# Patient Record
Sex: Male | Born: 1990 | Race: Black or African American | Hispanic: No | Marital: Single | Smoking: Never smoker
Health system: Southern US, Community
[De-identification: ages and names within clinical notes are randomized; demographics above are authoritative.]

## PROBLEM LIST (undated history)

## (undated) DIAGNOSIS — I10 Essential (primary) hypertension: Secondary | ICD-10-CM

---
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
## 2013-06-07 ENCOUNTER — Emergency Department: Payer: Self-pay

## 2014-01-12 ENCOUNTER — Emergency Department: Payer: Self-pay | Admitting: Emergency Medicine

## 2014-01-15 LAB — BETA STREP CULTURE(ARMC)

## 2014-04-09 ENCOUNTER — Emergency Department: Payer: Self-pay | Admitting: Emergency Medicine

## 2019-05-06 ENCOUNTER — Telehealth: Payer: Self-pay

## 2019-05-06 NOTE — Telephone Encounter (Signed)
Copied from CRM [id number]. Topic: Appointment Scheduling - New Patient >> May 06, 2019  2:25 PM Jeffery Kelly D wrote: New patient has been scheduled for your office.  Not yet Provider: Pt states that his grandmother Tiney Rouge sees Joycelyn Man and would like to know if she will see him as a new patient  Contact # [phone]   Route to department's PEC pool.

## 2019-05-07 NOTE — Telephone Encounter (Signed)
Yes this is fine, but he will not be able to be seen for NP visit until March due to [place] putting these visits on hold for acute visits

## 2019-05-07 NOTE — Telephone Encounter (Signed)
Left detailed message for patient to call back and schedule appointment in March.

## 2019-05-26 ENCOUNTER — Telehealth: Payer: Self-pay

## 2019-05-27 NOTE — Telephone Encounter (Signed)
Patient to call back and schedule NP appt.

## 2019-12-07 ENCOUNTER — Other Ambulatory Visit: Payer: Self-pay

## 2019-12-07 ENCOUNTER — Emergency Department: Payer: 59

## 2019-12-07 ENCOUNTER — Emergency Department
Admission: EM | Admit: 2019-12-07 | Discharge: 2019-12-07 | Disposition: A | Discharge status: Home / Self Care | Payer: 59 | Attending: Emergency Medicine | Admitting: Emergency Medicine

## 2019-12-07 DIAGNOSIS — Y9389 Activity, other specified: Secondary | ICD-10-CM | POA: Insufficient documentation

## 2019-12-07 DIAGNOSIS — Y998 Other external cause status: Secondary | ICD-10-CM | POA: Diagnosis not present

## 2019-12-07 DIAGNOSIS — Y9289 Other specified places as the place of occurrence of the external cause: Secondary | ICD-10-CM | POA: Diagnosis not present

## 2019-12-07 DIAGNOSIS — F10920 Alcohol use, unspecified with intoxication, uncomplicated: Secondary | ICD-10-CM

## 2019-12-07 LAB — COMPREHENSIVE METABOLIC PANEL
ALT: 21 U/L (ref 0–44)
AST: 30 U/L (ref 15–41)
Albumin: 4 g/dL (ref 3.5–5.0)
Alkaline Phosphatase: 49 U/L (ref 38–126)
Anion gap: 11 (ref 5–15)
BUN: 8 mg/dL (ref 6–20)
CO2: 25 mmol/L (ref 22–32)
Calcium: 9 mg/dL (ref 8.9–10.3)
Chloride: 106 mmol/L (ref 98–111)
Creatinine, Ser: 1.11 mg/dL (ref 0.61–1.24)
GFR calc Af Amer: >60 mL/min (ref >60)
GFR calc non Af Amer: >60 mL/min (ref >60)
Glucose, Bld: 126 mg/dL — ABNORMAL HIGH (ref 70–99)
Potassium: 4.3 mmol/L (ref 3.5–5.1)
Sodium: 142 mmol/L (ref 135–145)
Total Bilirubin: 0.9 mg/dL (ref 0.3–1.2)
Total Protein: 7.2 g/dL (ref 6.5–8.1)

## 2019-12-07 LAB — ACETAMINOPHEN LEVEL: Acetaminophen (Tylenol), Serum: <10 ug/mL — ABNORMAL LOW (ref 10–30)

## 2019-12-07 LAB — CBC WITH DIFFERENTIAL/PLATELET
Abs Immature Granulocytes: 0.04 10*3/uL (ref 0.00–0.07)
Basophils Absolute: 0 10*3/uL (ref 0.0–0.1)
Basophils Relative: 0 %
Eosinophils Absolute: 0 10*3/uL (ref 0.0–0.5)
Eosinophils Relative: 0 %
HCT: 49.3 % (ref 39.0–52.0)
Hemoglobin: 16.4 g/dL (ref 13.0–17.0)
Immature Granulocytes: 0 %
Lymphocytes Relative: 9 %
Lymphs Abs: 1.3 10*3/uL (ref 0.7–4.0)
MCH: 29.2 pg (ref 26.0–34.0)
MCHC: 33.3 g/dL (ref 30.0–36.0)
MCV: 87.7 fL (ref 80.0–100.0)
Monocytes Absolute: 0.3 10*3/uL (ref 0.1–1.0)
Monocytes Relative: 2 %
Neutro Abs: 12.6 10*3/uL — ABNORMAL HIGH (ref 1.7–7.7)
Neutrophils Relative %: 89 %
Platelets: 354 10*3/uL (ref 150–400)
RBC: 5.62 MIL/uL (ref 4.22–5.81)
RDW: 13.5 % (ref 11.5–15.5)
WBC: 14.2 10*3/uL — ABNORMAL HIGH (ref 4.0–10.5)
nRBC: 0 % (ref 0.0–0.2)

## 2019-12-07 LAB — TROPONIN I (HIGH SENSITIVITY): Troponin I (High Sensitivity): 3 ng/L (ref <18)

## 2019-12-07 LAB — LACTIC ACID, PLASMA: Lactic Acid, Venous: 1.7 mmol/L (ref 0.5–1.9)

## 2019-12-07 LAB — ETHANOL: Alcohol, Ethyl (B): 148 mg/dL — ABNORMAL HIGH (ref <10)
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
## 2019-12-07 NOTE — ED Notes (Signed)
Unable to complete screen or suicide questions due to patient lethargy.
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
## 2019-12-07 NOTE — ED Notes (Signed)
Pt taken to CT.

## 2020-07-23 ENCOUNTER — Emergency Department: Payer: 59

## 2020-07-23 ENCOUNTER — Other Ambulatory Visit: Payer: Self-pay

## 2020-07-23 ENCOUNTER — Emergency Department
Admission: EM | Admit: 2020-07-23 | Discharge: 2020-07-23 | Disposition: A | Discharge status: Home / Self Care | Payer: 59 | Attending: Emergency Medicine | Admitting: Emergency Medicine

## 2020-07-23 DIAGNOSIS — N23 Unspecified renal colic: Secondary | ICD-10-CM | POA: Diagnosis not present

## 2020-07-23 DIAGNOSIS — N200 Calculus of kidney: Secondary | ICD-10-CM | POA: Diagnosis not present

## 2020-07-23 DIAGNOSIS — R109 Unspecified abdominal pain: Secondary | ICD-10-CM | POA: Diagnosis present

## 2020-07-23 LAB — HEPATIC FUNCTION PANEL
ALT: 29 U/L (ref 0–44)
AST: 28 U/L (ref 15–41)
Albumin: 4.4 g/dL (ref 3.5–5.0)
Alkaline Phosphatase: 57 U/L (ref 38–126)
Bilirubin, Direct: 0.1 mg/dL (ref 0.0–0.2)
Indirect Bilirubin: 0.7 mg/dL (ref 0.3–0.9)
Total Bilirubin: 0.8 mg/dL (ref 0.3–1.2)
Total Protein: 7.7 g/dL (ref 6.5–8.1)

## 2020-07-23 LAB — URINALYSIS, ROUTINE W REFLEX MICROSCOPIC
Bilirubin Urine: NEGATIVE
Glucose, UA: NEGATIVE mg/dL
Hgb urine dipstick: NEGATIVE
Ketones, ur: NEGATIVE mg/dL
Leukocytes,Ua: NEGATIVE
Nitrite: NEGATIVE
Protein, ur: NEGATIVE mg/dL
Specific Gravity, Urine: 1.024 (ref 1.005–1.030)
pH: 7 (ref 5.0–8.0)

## 2020-07-23 LAB — CBC WITH DIFFERENTIAL/PLATELET
Abs Immature Granulocytes: 0.09 10*3/uL — ABNORMAL HIGH (ref 0.00–0.07)
Basophils Absolute: 0.1 10*3/uL (ref 0.0–0.1)
Basophils Relative: 0 %
Eosinophils Absolute: 0.1 10*3/uL (ref 0.0–0.5)
Eosinophils Relative: 1 %
HCT: 48.1 % (ref 39.0–52.0)
Hemoglobin: 16.3 g/dL (ref 13.0–17.0)
Immature Granulocytes: 1 %
Lymphocytes Relative: 9 %
Lymphs Abs: 1.7 10*3/uL (ref 0.7–4.0)
MCH: 29.4 pg (ref 26.0–34.0)
MCHC: 33.9 g/dL (ref 30.0–36.0)
MCV: 86.8 fL (ref 80.0–100.0)
Monocytes Absolute: 0.8 10*3/uL (ref 0.1–1.0)
Monocytes Relative: 4 %
Neutro Abs: 16 10*3/uL — ABNORMAL HIGH (ref 1.7–7.7)
Neutrophils Relative %: 85 %
Platelets: 441 10*3/uL — ABNORMAL HIGH (ref 150–400)
RBC: 5.54 MIL/uL (ref 4.22–5.81)
RDW: 12.5 % (ref 11.5–15.5)
WBC: 18.9 10*3/uL — ABNORMAL HIGH (ref 4.0–10.5)
nRBC: 0 % (ref 0.0–0.2)

## 2020-07-23 LAB — BASIC METABOLIC PANEL
Anion gap: 9 (ref 5–15)
BUN: 17 mg/dL (ref 6–20)
CO2: 23 mmol/L (ref 22–32)
Calcium: 9.6 mg/dL (ref 8.9–10.3)
Chloride: 105 mmol/L (ref 98–111)
Creatinine, Ser: 1.4 mg/dL — ABNORMAL HIGH (ref 0.61–1.24)
GFR, Estimated: >60 mL/min (ref >60)
Glucose, Bld: 104 mg/dL — ABNORMAL HIGH (ref 70–99)
Potassium: 4.1 mmol/L (ref 3.5–5.1)
Sodium: 137 mmol/L (ref 135–145)

## 2020-07-23 LAB — LIPASE, BLOOD: Lipase: 36 U/L (ref 11–51)

## 2020-07-23 MED ORDER — ONDANSETRON 4 MG PO TBDP: 4.0000 mg | ORAL_TABLET | Freq: Three times a day (TID) | ORAL | 0 refills | Status: DC | PRN

## 2020-07-23 MED ORDER — IBUPROFEN 800 MG PO TABS
800.0000 mg | ORAL_TABLET | Freq: Three times a day (TID) | ORAL | 0 refills | Status: DC | PRN
Start: 2020-07-23 — End: 2023-03-16

## 2020-07-23 MED ORDER — TAMSULOSIN HCL 0.4 MG PO CAPS
0.4000 mg | ORAL_CAPSULE | Freq: Every day | ORAL | 0 refills | Status: AC
Start: 2020-07-23 — End: 2020-07-30

## 2020-07-23 MED ORDER — IBUPROFEN 800 MG PO TABS
800.0000 mg | ORAL_TABLET | Freq: Once | ORAL | Status: AC
  Administered 2020-07-23: 800 mg via ORAL
  Filled 2020-07-23: qty 1

## 2020-07-23 MED ORDER — TAMSULOSIN HCL 0.4 MG PO CAPS
0.4000 mg | ORAL_CAPSULE | Freq: Once | ORAL | Status: AC
  Administered 2020-07-23: 0.4 mg via ORAL
  Filled 2020-07-23: qty 1

## 2020-07-23 NOTE — ED Provider Notes (Signed)
[facility] Emergency Department Provider Note  ____________________________________________  Time seen: Approximately 3:22 AM  I have reviewed the triage vital signs and the nursing notes.   HISTORY  Chief Complaint Flank Pain   [name] is a 30 y.o. male no significant past medical history who presents for evaluation of abdominal pain.  Patient reports the pain started earlier today.  Describes the pain as constant sharp located on the right upper lateral part of his abdomen, currently 6 out of 10.  Patient reports that while he was at work the pain became more severe and he took ibuprofen which helped for a little bit.  He denies nausea, vomiting, diarrhea, constipation, dysuria, hematuria.  No prior history of kidney stones.  No prior abdominal surgeries.  No chest pain or shortness of breath.   PMH None - reviewed  Allergies Patient has no allergy information on record.  No family history on file.  Social History Social History   Tobacco Use  . Smoking status: Never Smoker  . Smokeless tobacco: Never Used  Substance Use Topics  . Alcohol use: Yes  . Drug use: Not Currently    Review of Systems  Constitutional: Negative for fever. Eyes: Negative for visual changes. ENT: Negative for sore throat. Neck: No neck pain  Cardiovascular: Negative for chest pain. Respiratory: Negative for shortness of breath. Gastrointestinal: + abdominal pain. No vomiting or diarrhea. Genitourinary: Negative for dysuria. Musculoskeletal: Negative for back pain. Skin: Negative for rash. Neurological: Negative for headaches, weakness or numbness. Psych: No SI or HI  ____________________________________________   PHYSICAL EXAM:  VITAL SIGNS: ED Triage Vitals  Enc Vitals Group     BP 07/23/20 0245 (!) 165/76     Pulse Rate 07/23/20 0245 98     Resp 07/23/20 0245 18     Temp 07/23/20 0245 98.8 F (37.1 C)     Temp Source 07/23/20  0245 Oral     SpO2 07/23/20 0245 96 %     Weight 07/23/20 0243 215 lb (97.5 kg)     Height 07/23/20 0243 5\' 7"  (1.702 m)     Head Circumference --      Peak Flow --      Pain Score 07/23/20 0243 6     Pain Loc --      Pain Edu? --      Excl. in GC? --     Constitutional: Alert and oriented. Well appearing and in no apparent distress. HEENT:      Head: Normocephalic and atraumatic.         Eyes: Conjunctivae are normal. Sclera is non-icteric.       Mouth/Throat: Mucous membranes are moist.       Neck: Supple with no signs of meningismus. Cardiovascular: Regular rate and rhythm. No murmurs, gallops, or rubs. 2+ symmetrical distal pulses are present in all extremities. No JVD. Respiratory: Normal respiratory effort. Lungs are clear to auscultation bilaterally.  Gastrointestinal: Soft, non tender, and non distended with positive bowel sounds. No rebound or guarding. Genitourinary: No CVA tenderness. Musculoskeletal:  No edema, cyanosis, or erythema of extremities. Neurologic: Normal speech and language. Face is symmetric. Moving all extremities. No gross focal neurologic deficits are appreciated. Skin: Skin is warm, dry and intact. No rash noted. Psychiatric: Mood and affect are normal. Speech and behavior are normal.  ____________________________________________   LABS (all labs ordered are listed, but only abnormal results are displayed)  Labs Reviewed  URINALYSIS, ROUTINE W REFLEX MICROSCOPIC -  Abnormal; Notable for the following components:      Result Value   Color, Urine YELLOW (*)    APPearance HAZY (*)    All other components within normal limits  CBC WITH DIFFERENTIAL/PLATELET - Abnormal; Notable for the following components:   WBC 18.9 (*)    Platelets 441 (*)    Neutro Abs 16.0 (*)    Abs Immature Granulocytes 0.09 (*)    All other components within normal limits  BASIC METABOLIC PANEL - Abnormal; Notable for the following components:   Glucose, Bld 104 (*)     Creatinine, Ser 1.40 (*)    All other components within normal limits  LIPASE, BLOOD  HEPATIC FUNCTION PANEL   ____________________________________________  EKG  none  ____________________________________________  RADIOLOGY  I have personally reviewed the images performed during this visit and I agree with the Radiologist's read.   Interpretation by Radiologist:  CT Renal Stone Study  Result Date: 07/23/2020 CLINICAL DATA:  Right-sided flank pain EXAM: CT ABDOMEN AND PELVIS WITHOUT CONTRAST TECHNIQUE: Multidetector CT imaging of the abdomen and pelvis was performed following the standard protocol without IV contrast. COMPARISON:  None. FINDINGS: Lower chest: No acute abnormality. Hepatobiliary: No focal liver abnormality is seen. No gallstones, gallbladder wall thickening, or biliary dilatation. Pancreas: Unremarkable. No pancreatic ductal dilatation or surrounding inflammatory changes. Spleen: Normal in size without focal abnormality. Adrenals/Urinary Tract: Adrenal glands are within normal limits. Left kidney demonstrates multiple tiny nonobstructing renal stones. Left ureter is unremarkable. Right kidney demonstrates tiny nonobstructing stones although mild hydronephrosis and hydroureter is noted secondary to a small right UVJ stone measuring 2-3 mm. Bladder is decompressed. Stomach/Bowel: The appendix is within normal limits. No obstructive or inflammatory changes of the colon small seen. Stomach is within normal limits. Vascular/Lymphatic: No significant vascular findings are present. No enlarged abdominal or pelvic lymph nodes. Reproductive: Prostate is unremarkable. Other: No abdominal wall hernia or abnormality. No abdominopelvic ascites. Musculoskeletal: No acute or significant osseous findings. IMPRESSION: 2-3 mm right UVJ stone with mild hydronephrosis and hydroureter. Tiny nonobstructing renal stones bilaterally. No other focal abnormality is noted. Electronically Signed   By: Alcide Clever M.D.   On: 07/23/2020 03:55      ____________________________________________   PROCEDURES  Procedure(s) performed: None Procedures Critical Care performed:  None ____________________________________________   INITIAL IMPRESSION / ASSESSMENT AND PLAN / ED COURSE   30 y.o. male no significant past medical history who presents for evaluation of right upper abdominal pain.  Patient is well-appearing in no distress with normal vital signs, abdomen soft with no tenderness, no CVA tenderness.  Differential diagnoses including kidney stone versus pyelonephritis versus gallbladder pathology versus pancreatitis versus appendicitis.  Plan for CBC, CMP, lipase, urinalysis, and imaging.  Will give 800 mg of ibuprofen for pain.  _________________________ 4:23 AM on 07/23/2020 -----------------------------------------  CT consistent with a 2 mm right UVJ stone, visualized by me and confirmed by radiology.  UA with no signs of overlying infection.  No AKI.  After 800 mg of ibuprofen patient is pain-free.  Will discharge home on Flomax, ibuprofen, Zofran.  Discussed dietary changes and follow-up with PCP.  Discussed my return precautions for any signs of UTI, uncontrolled pain, uncontrolled nausea or vomiting, or fever.     _____________________________________________ Please note:  Patient was evaluated in Emergency Department today for the symptoms described in the history of present illness. Patient was evaluated in the context of the global COVID-19 pandemic, which necessitated consideration that the patient might be  at risk for infection with the SARS-CoV-2 virus that causes COVID-19. Institutional protocols and algorithms that pertain to the evaluation of patients at risk for COVID-19 are in a state of rapid change based on information released by regulatory bodies including the CDC and federal and state organizations. These policies and algorithms were followed during the patient's care  in the ED.  Some ED evaluations and interventions may be delayed as a result of limited staffing during the pandemic.   [place] Controlled Substance Database was reviewed by me. ____________________________________________   FINAL CLINICAL IMPRESSION(S) / ED DIAGNOSES   Final diagnoses:  Renal colic  Kidney stone      NEW MEDICATIONS STARTED DURING THIS VISIT:  ED Discharge Orders         Ordered    ibuprofen (ADVIL) 800 MG tablet  Every 8 hours PRN        07/23/20 0422    tamsulosin (FLOMAX) 0.4 MG CAPS capsule  Daily        07/23/20 0422    ondansetron (ZOFRAN ODT) 4 MG disintegrating tablet  Every 8 hours PRN        07/23/20 0422           Note:  This document was prepared using Dragon voice recognition software and may include unintentional dictation errors.    Don Perking, [place], MD 07/23/20 [phone]

## 2020-07-23 NOTE — ED Triage Notes (Signed)
Pt states when he woke up this morning he began to have right sided flank pain, Pt states he is having a decrease in urine today but denies burning or blood in urine.

## 2020-07-23 NOTE — Discharge Instructions (Addendum)
You have been seen in the Emergency Department (ED)  Today and was diagnosed with kidney stones. While the stone is traveling through the ureter, which is the tube that carries urine from the kidney to the bladder, you will probably feel pain. The pain may be mild or very severe. You may also have some blood in your urine. As soon as the stone reaches the bladder, any intense pain should go away. If a stone is too large to pass on its own, you may need a medical procedure to help you pass the stone.   As we have discussed, please drink plenty of fluids and use a urinary strainer to attempt to capture the stone.  Please make a follow up appointment with Urology in the next week by calling the number below and bring the stone with you.  Pain control: Take ibuprofen 800mg  every 6 hours for the pain. Please also take your prescribed flomax daily.   Follow-up with your doctor or return to the ER in 12-24 hours if your pain is not well controlled, if you develop pain or burning with urination, or if you develop a fever. Otherwise follow up in 3-5 days with your doctor.  When should you call for help?  Call your doctor now or seek immediate medical care if:  You cannot keep down fluids.  Your pain gets worse.  You have a fever or chills.  You have new or worse pain in your back just below your rib cage (the flank area).  You have new or more blood in your urine. You have pain or burning with urination You are unable to urinate You have abdominal pain  Watch closely for changes in your health, and be sure to contact your doctor if:  You do not get better as expected  How can you care for yourself at home?  Drink plenty of fluids, enough so that your urine is light yellow or clear like water. If you have kidney, heart, or liver disease and have to limit fluids, talk with your doctor before you increase the amount of fluids you drink.  Take pain medicines exactly as directed. Call your doctor if you  think you are having a problem with your medicine.  If the doctor gave you a prescription medicine for pain, take it as prescribed.  If you are not taking a prescription pain medicine, ask your doctor if you can take an over-the-counter medicine. Read and follow all instructions on the label. Your doctor may ask you to strain your urine so that you can collect your kidney stone when it passes. You can use a kitchen strainer or a tea strainer to catch the stone. Store it in a plastic bag until you see your doctor again.  Preventing future kidney stones  Some changes in your diet may help prevent kidney stones. Depending on the cause of your stones, your doctor may recommend that you:  Drink plenty of fluids, enough so that your urine is light yellow or clear like water. If you have kidney, heart, or liver disease and have to limit fluids, talk with your doctor before you increase the amount of fluids you drink.  Limit coffee, tea, and alcohol. Also avoid grapefruit juice.  Do not take more than the recommended daily dose of vitamins C and D.  Avoid antacids such as Gaviscon, Maalox, Mylanta, or Tums.  Limit the amount of salt (sodium) in your diet.  Eat a balanced diet that is not too high  in protein.  Limit foods that are high in a substance called oxalate, which can cause kidney stones. These foods include dark green vegetables, rhubarb, chocolate, wheat bran, nuts, cranberries, and beans.
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
## 2022-10-09 NOTE — Progress Notes (Deleted)
    I,Dejon Lukas,acting as a Neurosurgeon for [employer], PA-C.,have documented all relevant documentation on the behalf of Alfredia Ferguson, PA-C,as directed by  Alfredia Ferguson, PA-C while in the presence of Alfredia Ferguson, PA-C.  New patient visit   Patient: Jeffery Kelly   DOB: 07/31/1990   32 y.o. Male  MRN: [id number] Visit Date: 10/10/2022  Today's healthcare provider: Alfredia Ferguson, PA-C   No chief complaint on file.  Subjective    Jeffery Kelly is a 32 y.o. male who presents today as a new patient to establish care.  HPI  ***  No past medical history on file. No past surgical history on file. No family status information on file.   No family history on file. Social History   Socioeconomic History   Marital status: Single    Spouse name: Not on file   Number of children: Not on file   Years of education: Not on file   Highest education level: Not on file  Occupational History   Not on file  Tobacco Use   Smoking status: Never   Smokeless tobacco: Never  Substance and Sexual Activity   Alcohol use: Yes   Drug use: Not Currently   Sexual activity: Not on file  Other Topics Concern   Not on file  Social History Narrative   Not on file   Social Determinants of Health   Financial Resource Strain: Not on file  Food Insecurity: Not on file  Transportation Needs: Not on file  Physical Activity: Not on file  Stress: Not on file  Social Connections: Not on file   Outpatient Medications Prior to Visit  Medication Sig   ibuprofen (ADVIL) 800 MG tablet Take 1 tablet (800 mg total) by mouth every 8 (eight) hours as needed.   ondansetron (ZOFRAN ODT) 4 MG disintegrating tablet Take 1 tablet (4 mg total) by mouth every 8 (eight) hours as needed.   No facility-administered medications prior to visit.   Not on File   There is no immunization history on file for this patient.  Health Maintenance  Topic Date Due   COVID-19 Vaccine (1) Never  done   HIV Screening  Never done   Hepatitis C Screening  Never done   DTaP/Tdap/Td (1 - Tdap) Never done   INFLUENZA VACCINE  11/23/2022   HPV VACCINES  Aged Out    Patient Care Team: Patient, No Pcp Per as PCP - General (General Practice)  Review of Systems  {Labs  Heme  Chem  Endocrine  Serology  Results Review (optional):23779}   Objective    There were no vitals taken for this visit. {Show previous Tatanisha Cuthbert signs (optional):23777}  Physical Exam ***  Depression Screen     No data to display         No results found for any visits on 10/10/22.  Assessment & Plan     ***  No follow-ups on file.     {provider attestation***:1}   Alfredia Ferguson, PA-C  [facility] [phone] (phone) [phone] (fax)  [facility]

## 2022-10-10 ENCOUNTER — Ambulatory Visit: Payer: Self-pay | Admitting: Physician Assistant

## 2022-11-28 ENCOUNTER — Ambulatory Visit: Payer: Self-pay | Admitting: Physician Assistant

## 2023-01-21 NOTE — Progress Notes (Deleted)
New patient visit  Patient: Jeffery Kelly   DOB: 09/25/1990   32 y.o. Male  MRN: [id number] Visit Date: 01/25/2023  Today's healthcare provider: Debera Lat, PA-C   No chief complaint on file.  Subjective    Jeffery Kelly is a 32 y.o. male who presents today as a new patient to establish care.  HPI  *** Discussed the use of AI scribe software for clinical note transcription with the patient, who gave verbal consent to proceed.  History of Present Illness            No past medical history on file. No past surgical history on file. No family status information on file.   No family history on file. Social History   Socioeconomic History   Marital status: Single    Spouse name: Not on file   Number of children: Not on file   Years of education: Not on file   Highest education level: Not on file  Occupational History   Not on file  Tobacco Use   Smoking status: Never   Smokeless tobacco: Never  Substance and Sexual Activity   Alcohol use: Yes   Drug use: Not Currently   Sexual activity: Not on file  Other Topics Concern   Not on file  Social History Narrative   Not on file   Social Determinants of Health   Financial Resource Strain: Not on file  Food Insecurity: Not on file  Transportation Needs: Not on file  Physical Activity: Not on file  Stress: Not on file  Social Connections: Not on file   Outpatient Medications Prior to Visit  Medication Sig   ibuprofen (ADVIL) 800 MG tablet Take 1 tablet (800 mg total) by mouth every 8 (eight) hours as needed.   ondansetron (ZOFRAN ODT) 4 MG disintegrating tablet Take 1 tablet (4 mg total) by mouth every 8 (eight) hours as needed.   No facility-administered medications prior to visit.   Not on File  Immunization History  Administered Date(s) Administered   DTaP 10/20/1992, 12/29/1992, 06/29/1993, 08/29/1996   HIB (PRP-OMP) 08/30/1992, 10/20/1992, 12/29/1992, 06/29/1993   HPV 9-valent  05/23/2019   HPV Quadrivalent 06/20/2011   Hepatitis B, PED/ADOLESCENT 1990-07[birth year], 08/30/1992, 02/04/1993   IPV 08/30/1992, 10/20/1992, 12/29/1992, 08/29/1996   MMR 06/29/1993, 08/29/1996   Meningococcal Conjugate 06/20/2011   Tdap 06/20/2011   Varicella 06/20/2011    Health Maintenance  Topic Date Due   HIV Screening  Never done   Hepatitis C Screening  Never done   HPV VACCINES (3 - Male 3-dose series) 08/15/2019   DTaP/Tdap/Td (6 - Td or Tdap) 06/19/2021   INFLUENZA VACCINE  Never done   COVID-19 Vaccine (1 - 2023-24 season) Never done    Patient Care Team: Patient, No Pcp Per as PCP - General (General Practice)  Review of Systems  All other systems reviewed and are negative.  Except see HPI   {Insert previous labs (optional):23779} {See past labs  Heme  Chem  Endocrine  Serology  Results Review (optional):1}   Objective    There were no vitals taken for this visit. {Insert last BP/Wt (optional):23777}{See vitals history (optional):1}   Physical Exam Vitals reviewed.  Constitutional:      General: He is not in acute distress.    Appearance: Normal appearance. He is not diaphoretic.  HENT:     Head: Normocephalic and atraumatic.  Eyes:     General: No scleral icterus.    Conjunctiva/sclera: Conjunctivae normal.  Cardiovascular:  Rate and Rhythm: Normal rate and regular rhythm.     Pulses: Normal pulses.     Heart sounds: Normal heart sounds. No murmur heard. Pulmonary:     Effort: Pulmonary effort is normal. No respiratory distress.     Breath sounds: Normal breath sounds. No wheezing or rhonchi.  Musculoskeletal:     Cervical back: Neck supple.     Right lower leg: No edema.     Left lower leg: No edema.  Lymphadenopathy:     Cervical: No cervical adenopathy.  Skin:    General: Skin is warm and dry.     Findings: No rash.  Neurological:     Mental Status: He is alert and oriented to person, place, and time. Mental status is at baseline.   Psychiatric:        Mood and Affect: Mood normal.        Behavior: Behavior normal.     Depression Screen     No data to display         No results found for any visits on 01/25/23.  Assessment & Plan     *** Assessment and Plan              Encounter to establish care Welcomed to our clinic Reviewed past medical hx, social hx, family hx and surgical hx Pt advised to send all vaccination records or screening   No follow-ups on file.    The patient was advised to call back or seek an in-person evaluation if the symptoms worsen or if the condition fails to improve as anticipated.  I discussed the assessment and treatment plan with the patient. The patient was provided an opportunity to ask questions and all were answered. The patient agreed with the plan and demonstrated an understanding of the instructions.  I, Debera Lat, PA-C have reviewed all documentation for this visit. The documentation on  01/25/23 for the exam, diagnosis, procedures, and orders are all accurate and complete.  Debera Lat, [facility], MMS [facility] [phone] (phone) [phone] (fax)  [facility]

## 2023-01-25 ENCOUNTER — Ambulatory Visit: Payer: Self-pay | Admitting: Physician Assistant

## 2023-01-25 DIAGNOSIS — Z7689 Persons encountering health services in other specified circumstances: Secondary | ICD-10-CM

## 2023-03-16 ENCOUNTER — Ambulatory Visit: Payer: Self-pay | Admitting: Family Medicine

## 2023-03-16 ENCOUNTER — Encounter: Payer: Self-pay | Admitting: Family Medicine

## 2023-03-16 VITALS — BP 137/90 | HR 74 | Resp 18 | Ht 67.0 in | Wt 225.0 lb

## 2023-03-16 DIAGNOSIS — G4730 Sleep apnea, unspecified: Secondary | ICD-10-CM

## 2023-03-16 DIAGNOSIS — I499 Cardiac arrhythmia, unspecified: Secondary | ICD-10-CM

## 2023-03-16 DIAGNOSIS — Z87442 Personal history of urinary calculi: Secondary | ICD-10-CM

## 2023-03-16 DIAGNOSIS — I10 Essential (primary) hypertension: Secondary | ICD-10-CM

## 2023-03-16 DIAGNOSIS — Z7689 Persons encountering health services in other specified circumstances: Secondary | ICD-10-CM

## 2023-03-16 MED ORDER — AMLODIPINE BESYLATE 5 MG PO TABS: 5.0000 mg | ORAL_TABLET | Freq: Every day | ORAL | 0 refills | Status: DC

## 2023-03-16 NOTE — Assessment & Plan Note (Addendum)
Consistently elevated since 2021. Will start amlodipine 5 mg daily. Patient to obtain BP cuff and monitor at home, then send the record of his BPs through MyChart.

## 2023-03-16 NOTE — Progress Notes (Signed)
New patient visit   Patient: Jeffery Kelly   DOB: 07-30-1990   32 y.o. Male  MRN: [id number] Visit Date: 03/16/2023  Today's healthcare provider: Sherlyn Hay, DO   Chief Complaint  Patient presents with   Establish Care   Subjective    Jeffery Kelly is a 32 y.o. male who presents today as a new patient to establish care.  HPI  Concerns about his sleep patterns: He reports loud snoring and was interested in a sleep study due to his mother's observations and his own history of sleep issues.   The patient's hypertension has been an ongoing issue, and he has not been under medical care for a few years. He reported that a previous healthcare provider had suggested wearing a heart monitor, but the patient did not recall the reason for this recommendation.  The patient also has a history of kidney stones but reports no recent issues related to this. He denied any current symptoms of allergies, fevers, chills, dizziness, lightheadedness, nausea, vomiting, or constipation.  The patient's cholesterol was noted to be elevated in a previous test in 2021, but no medication was initiated at that time. He denied any current smoking, alcohol use, or recreational drug use.   History reviewed. No pertinent past medical history. History reviewed. No pertinent surgical history. Family Status  Relation Name Status   Mother  (Not Specified)   MGM  (Not Specified)   MGF  (Not Specified)   PGM  (Not Specified)  No partnership data on file   Family History  Problem Relation Age of Onset   Hypertension Mother    Diabetes Mother    Cancer Maternal Grandmother    Cancer Maternal Grandfather    Hypertension Paternal Grandmother    Diabetes Paternal Grandmother    Social History   Socioeconomic History   Marital status: Single    Spouse name: Not on file   Number of children: Not on file   Years of education: Not on file   Highest education level: Not on file   Occupational History   Not on file  Tobacco Use   Smoking status: Never   Smokeless tobacco: Never  Vaping Use   Vaping status: Never Used  Substance and Sexual Activity   Alcohol use: Never   Drug use: Never   Sexual activity: Not on file  Other Topics Concern   Not on file  Social History Narrative   Not on file   Social Determinants of Health   Financial Resource Strain: Not on file  Food Insecurity: Not on file  Transportation Needs: Not on file  Physical Activity: Not on file  Stress: Not on file  Social Connections: Not on file   Outpatient Medications Prior to Visit  Medication Sig   [DISCONTINUED] ibuprofen (ADVIL) 800 MG tablet Take 1 tablet (800 mg total) by mouth every 8 (eight) hours as needed.   [DISCONTINUED] ondansetron (ZOFRAN ODT) 4 MG disintegrating tablet Take 1 tablet (4 mg total) by mouth every 8 (eight) hours as needed.   No facility-administered medications prior to visit.   No Known Allergies  Immunization History  Administered Date(s) Administered   DTaP 10/20/1992, 12/29/1992, 06/29/1993, 08/29/1996   HIB (PRP-OMP) 08/30/1992, 10/20/1992, 12/29/1992, 06/29/1993   HPV 9-valent 05/23/2019   HPV Quadrivalent 06/20/2011   Hepatitis B, PED/ADOLESCENT 07/12/1992, 08/30/1992, 02/04/1993   IPV 08/30/1992, 10/20/1992, 12/29/1992, 08/29/1996   MMR 06/29/1993, 08/29/1996   Meningococcal Conjugate 06/20/2011   Tdap 06/20/2011   Varicella 06/20/2011  Health Maintenance  Topic Date Due   HIV Screening  Never done   HPV VACCINES (3 - Male 3-dose series) 08/15/2019   DTaP/Tdap/Td (6 - Td or Tdap) 06/19/2021   INFLUENZA VACCINE  07/23/2023 (Originally 11/23/2022)   COVID-19 Vaccine (1 - 2023-24 season) 12/24/2023 (Originally 12/24/2022)   Hepatitis C Screening  Discontinued    Patient Care Team: Jaxen Samples, Monico Blitz, DO as PCP - General (Family Medicine)  Review of Systems  Constitutional:  Negative for chills and fever.  Eyes:  Negative for visual  disturbance.  Respiratory:  Positive for apnea. Negative for cough, shortness of breath and wheezing.   Cardiovascular:  Negative for chest pain, palpitations and leg swelling.  Gastrointestinal:  Negative for abdominal pain, nausea and vomiting.  Genitourinary:  Negative for dysuria and frequency.  Neurological:  Negative for dizziness, tremors, weakness, light-headedness and headaches.        Objective    BP (!) 137/90 (BP Location: Right Arm, Patient Position: Sitting, Cuff Size: Large)   Pulse 74   Resp 18   Ht 5\' 7"  (1.702 m)   Wt 225 lb (102.1 kg)   SpO2 99%   BMI 35.24 kg/m     Physical Exam Vitals reviewed.  Constitutional:      General: He is not in acute distress.    Appearance: Normal appearance. He is not diaphoretic.  HENT:     Head: Normocephalic and atraumatic.  Eyes:     General: No scleral icterus.    Conjunctiva/sclera: Conjunctivae normal.  Cardiovascular:     Rate and Rhythm: Normal rate. Rhythm irregular.     Pulses: Normal pulses.     Heart sounds: Normal heart sounds. No murmur heard.    Comments: Significant fluctuation in rate Pulmonary:     Effort: Pulmonary effort is normal. No respiratory distress.     Breath sounds: Normal breath sounds. No wheezing or rhonchi.  Musculoskeletal:     Cervical back: Neck supple.     Right lower leg: No edema.     Left lower leg: No edema.  Lymphadenopathy:     Cervical: No cervical adenopathy.  Skin:    General: Skin is warm and dry.     Findings: No rash.  Neurological:     Mental Status: He is alert and oriented to person, place, and time. Mental status is at baseline.  Psychiatric:        Mood and Affect: Mood normal.        Behavior: Behavior normal.    Depression Screen    03/16/2023    1:15 PM  PHQ 2/9 Scores  PHQ - 2 Score 0  PHQ- 9 Score 0   No results found for any visits on 03/16/23.  Assessment & Plan     Primary hypertension Assessment & Plan: Consistently elevated since  2021. Will start amlodipine 5 mg daily. Patient to obtain BP cuff and monitor at home, then send the record of his BPs through MyChart.  Orders: -     Comprehensive metabolic panel -     Lipid panel -     amLODIPine Besylate; Take 1 tablet (5 mg total) by mouth daily.  Dispense: 90 tablet; Refill: 0  Establishing care with new doctor, encounter for  Observed sleep apnea Assessment & Plan: ESS 8 STOPBANG - 7/8  Referring patient for home sleep study.  Orders: -     Ambulatory referral to Sleep Studies  History of nephrolithiasis Assessment & Plan: Noted. No acute  concerns.  Continue to monitor.    Cardiac arrhythmia, unspecified cardiac arrhythmia type Assessment & Plan: Irregular heart rate with significant variability of rate noted. No family history of sudden cardiac death or early heart attacks. Patient prefers to wait for insurance before further evaluation. Discussed monitoring and potential need for a heart monitor. Patient agreed to follow up with cardiology after insurance starts. - Refer to cardiology - Offered EKG and cardiac monitor but patient would prefer to wait until he sees cardiology after the first of the year due to lack of insurance now. - Advised patient to seek emergency care if experiencing chest pain or weakness/fainting   Orders: -     Ambulatory referral to Cardiology   Return in about 3 months (around 06/16/2023) for HTN, CPE.     I discussed the assessment and treatment plan with the patient  The patient was provided an opportunity to ask questions and all were answered. The patient agreed with the plan and demonstrated an understanding of the instructions.   The patient was advised to call back or seek an in-person evaluation if the symptoms worsen or if the condition fails to improve as anticipated.    Sherlyn Hay, DO  [facility] [phone] (phone) [phone] (fax)  [facility]

## 2023-03-16 NOTE — Patient Instructions (Signed)

## 2023-03-16 NOTE — Assessment & Plan Note (Addendum)
Irregular heart rate with significant variability of rate noted. No family history of sudden cardiac death or early heart attacks. Patient prefers to wait for insurance before further evaluation. Discussed monitoring and potential need for a heart monitor. Patient agreed to follow up with cardiology after insurance starts. - Refer to cardiology - Offered EKG and cardiac monitor but patient would prefer to wait until he sees cardiology after the first of the year due to lack of insurance now. - Advised patient to seek emergency care if experiencing chest pain or weakness/fainting

## 2023-03-16 NOTE — Assessment & Plan Note (Signed)
ESS 8 STOPBANG - 7/8  Referring patient for home sleep study.

## 2023-03-16 NOTE — Assessment & Plan Note (Signed)
Noted. No acute concerns.  Continue to monitor.

## 2023-04-19 ENCOUNTER — Telehealth: Payer: Self-pay | Admitting: Family Medicine

## 2023-04-19 DIAGNOSIS — I10 Essential (primary) hypertension: Secondary | ICD-10-CM

## 2023-04-19 MED ORDER — AMLODIPINE BESYLATE 5 MG PO TABS: 5.0000 mg | ORAL_TABLET | Freq: Every day | ORAL | 0 refills | Status: DC

## 2023-04-19 NOTE — Telephone Encounter (Signed)
Received fax from [facility] wanting refills on Amlodipine 5 mg. #90

## 2023-06-15 ENCOUNTER — Encounter: Payer: Self-pay | Admitting: Family Medicine

## 2023-06-21 ENCOUNTER — Telehealth: Payer: Self-pay | Admitting: Family Medicine

## 2023-06-21 NOTE — Telephone Encounter (Signed)
 Copied from CRM [id number]. Topic: Referral - Request for Referral >> Jun 21, 2023  8:03 AM Franchot Heidelberg wrote: Did the patient discuss referral with their provider in the last year? Yes (If No - schedule appointment) (If Yes - send message)  Appointment offered? No  Type of order/referral and detailed reason for visit: Sleep study   Preference of office, provider, location: Highest recommended   If referral order, have you been seen by this specialty before? Yes (If Yes, this issue or another issue? When? Where?  Can we respond through MyChart? Yes

## 2023-06-21 NOTE — Addendum Note (Signed)
 Addended by: Marjie Skiff on: 06/21/2023 11:00 AM   Modules accepted: Orders

## 2023-07-02 ENCOUNTER — Telehealth: Payer: Self-pay

## 2023-07-02 NOTE — Telephone Encounter (Signed)
 Copied from CRM [id number]. Topic: Referral - Status >> Jul 02, 2023  9:47 AM Elmarie Shiley S wrote: Reason for CRM: Patient he called in 2 weeks ago about getting a referral for sleep study. Patient requesting call back   [phone]

## 2023-07-03 ENCOUNTER — Encounter: Payer: Self-pay | Admitting: Family Medicine

## 2023-09-10 ENCOUNTER — Ambulatory Visit: Payer: Self-pay | Admitting: *Deleted

## 2023-09-10 NOTE — Telephone Encounter (Signed)
 First attempt to return her call.   Left a voicemail to call back.   Will attempt again later.

## 2023-09-10 NOTE — Telephone Encounter (Signed)
  Chief Complaint: requesting appt , difficulty starting urination, ED Symptoms: urgency urination, bladder feels full at times. Difficulty starting stream and starts and stops while urinating x 1 month. Erectile dysfunction x 1 week. Went to ED yesterday and recommended to see PCP Frequency: 1 month Pertinent Negatives: Patient denies fever no dark colored urine no odor no pain Disposition: [] ED /[] Urgent Care (no appt availability in office) / [x] Appointment(In office/virtual)/ []  [facility]/ [] Home Care/ [] Refused Recommended Disposition /[] [facility]/ []  Follow-up with PCP Additional Notes:   Appt scheduled with PCP tomorrow.       Reason for Disposition  All other males with painful urination  Answer Assessment - Initial Assessment Questions 1. SEVERITY: "How bad is the pain?"  (e.g., Scale 1-10; mild, moderate, or severe)   - MILD (1-3): Complains slightly about urination hurting.   - MODERATE (4-7): Interferes with normal activities.     - SEVERE (8-10): Excruciating, unwilling or unable to urinate because of the pain.      Na  2. FREQUENCY: "How many times have you had painful urination today?"      Na  3. PATTERN: "Is pain present every time you urinate or just sometimes?"      Difficulty starting stream  and starts and stops while attempting to urinate. Erectile dysfunction started 1 week ago  4. ONSET: "When did the painful urination start?"      na 5. FEVER: "Do you have a fever?" If Yes, ask: "What is your temperature, how was it measured, and when did it start?"     na 6. PAST UTI: "Have you had a urine infection before?" If Yes, ask: "When was the last time?" and "What happened that time?"      na 7. CAUSE: "What do you think is causing the painful urination?"      Not sure  8. OTHER SYMPTOMS: "Do you have any other symptoms?" (e.g., flank pain, penis discharge, scrotal pain, blood in urine)     Difficulty starting stream to urinate and  starts and stops. Bladder feels full. ED x 1 week ago  Protocols used: Urination Pain - Male-A-AH

## 2023-09-10 NOTE — Telephone Encounter (Signed)
 Message from [place] C sent at 09/10/2023  8:35 AM EDT  Copied From CRM [id number]. Reason for Triage: Patient has been having problem with his prostate with about a month has gotten worse, went to er yesterday was advise to go to his primary care    Call History  Contact Date/Time Type Contact Phone/Fax By  09/10/2023 08:28 AM EDT Phone (Incoming) [place], [place] [phone] Mable Saver

## 2023-09-11 ENCOUNTER — Ambulatory Visit: Payer: Self-pay | Admitting: Family Medicine

## 2023-09-11 ENCOUNTER — Encounter: Payer: Self-pay | Admitting: Family Medicine

## 2023-09-11 VITALS — BP 137/90 | HR 93 | Temp 98.3°F | Ht 67.0 in | Wt 225.1 lb

## 2023-09-11 DIAGNOSIS — E785 Hyperlipidemia, unspecified: Secondary | ICD-10-CM

## 2023-09-11 DIAGNOSIS — R350 Frequency of micturition: Secondary | ICD-10-CM

## 2023-09-11 DIAGNOSIS — R399 Unspecified symptoms and signs involving the genitourinary system: Secondary | ICD-10-CM

## 2023-09-11 DIAGNOSIS — Z833 Family history of diabetes mellitus: Secondary | ICD-10-CM

## 2023-09-11 DIAGNOSIS — Z6835 Body mass index (BMI) 35.0-35.9, adult: Secondary | ICD-10-CM

## 2023-09-11 DIAGNOSIS — R34 Anuria and oliguria: Secondary | ICD-10-CM | POA: Diagnosis not present

## 2023-09-11 DIAGNOSIS — R3915 Urgency of urination: Secondary | ICD-10-CM | POA: Diagnosis not present

## 2023-09-11 DIAGNOSIS — Z130 Encounter for screening for diseases of the blood and blood-forming organs and certain disorders involving the immune mechanism: Secondary | ICD-10-CM

## 2023-09-11 DIAGNOSIS — Z114 Encounter for screening for human immunodeficiency virus [HIV]: Secondary | ICD-10-CM

## 2023-09-11 DIAGNOSIS — Z1329 Encounter for screening for other suspected endocrine disorder: Secondary | ICD-10-CM

## 2023-09-11 DIAGNOSIS — I10 Essential (primary) hypertension: Secondary | ICD-10-CM

## 2023-09-11 MED ORDER — TADALAFIL 5 MG PO TABS: 5.0000 mg | ORAL_TABLET | Freq: Every day | ORAL | 0 refills | Status: AC

## 2023-09-11 MED ORDER — AMLODIPINE BESYLATE 10 MG PO TABS: 10.0000 mg | ORAL_TABLET | Freq: Every day | ORAL | 1 refills | Status: AC

## 2023-09-11 NOTE — Patient Instructions (Addendum)
 [facility] [address], [address] [place] [address] Phone: [phone] Fax: [phone]      Check your blood pressure once daily, and any time you have concerning symptoms like headache, chest pain, dizziness, shortness of breath, or vision changes.   Our goal is less than 130/80.  To appropriately check your blood pressure, make sure you do the following:  1) Avoid caffeine, exercise, or tobacco products for 30 minutes before checking. Empty your bladder. 2) Sit with your back supported in a flat-backed chair. Rest your arm on something flat (arm of the chair, table, etc). 3) Sit still with your feet flat on the floor, resting, for at least 5 minutes.  4) Check your blood pressure. Take 1-2 readings.  5) Write down these readings and bring with you to any provider appointments.  Bring your home blood pressure machine with you to a provider's office for accuracy comparison at least once a year.   Make sure you take your blood pressure medications before you come to any office visit, even if you were asked to fast for labs.

## 2023-09-11 NOTE — Progress Notes (Signed)
 Established patient visit   Patient: Jeffery Kelly   DOB: 1991-03-20   33 y.o. Male  MRN: [id number] Visit Date: 09/11/2023  Today's healthcare provider: Carlean Charter, DO   Chief Complaint  Patient presents with   Urinary Retention    Patients is reporting that he has the urgency of needed to urinate however when he goes to urinate he doesn't have to.  He feels like that the blood flow in that area is not how it used to be.  Had it for a week, went away and it came back 2 days ago.  Reports not constant just reoccurring.  Patient states he went to [facility] ER on Sunday and they stated it was not an UTI.  Reports no pain, swelling, and no discharge.  Denies any treatments.   Subjective    [name] is a 33 year old male who presents with urinary frequency and erectile dysfunction.  He experiences urinary frequency, characterized by urinating small amounts throughout the day and night. Working third shift, he wakes up two to three times during his sleep to urinate. He feels the need to urinate urgently but often feels as though he has not completely emptied his bladder, despite no burning sensation during urination. These symptoms have been present for the past few days, with a similar episode occurring for a week in March. No dysuria, hematuria, or significant pain is reported. He does report suprapubic pressure  He has been experiencing erectile dysfunction, noting difficulty maintaining an erection over the past two weeks. He can achieve an erection, but it does not last. He consumes alcohol occasionally, with the last intake being on Saturday, where he consumed two 'beat box' drinks and two to three shots of Hennessy. No pain, swelling, or discharge in the genital area is reported.  He has a history of hypertension and is currently taking amlodipine , which he restarted at the end of April after missing doses. He is unsure of the dosage but confirms daily  intake since restarting. He has not been checking his blood pressure at home.  There is a family history of early cardiac death. His mother reported that her boyfriend experienced similar urinary symptoms related to prostate enlargement, but this is not a direct family history.  He consumes spicy foods regularly (especially hot sauce) and drinks alcohol about once a week, including the day before his symptoms began. He denies any recent changes in diet, physical activity, or the use of recreational drugs. He does not smoke. No fever, chills, nausea, vomiting, or diarrhea is reported.      Medications: Outpatient Medications Prior to Visit  Medication Sig   [DISCONTINUED] amLODipine  (NORVASC ) 5 MG tablet Take 1 tablet (5 mg total) by mouth daily.   No facility-administered medications prior to visit.        Objective    BP (!) 137/90 (BP Location: Left Arm, Patient Position: Sitting, Cuff Size: Normal)   Pulse 93   Temp 98.3 F (36.8 C) (Oral)   Ht 5\' 7"  (1.702 m)   Wt 225 lb 1.6 oz (102.1 kg)   SpO2 97%   BMI 35.26 kg/m     Physical Exam Vitals and nursing note reviewed.  Constitutional:      General: He is not in acute distress.    Appearance: Normal appearance.  HENT:     Head: Normocephalic and atraumatic.  Eyes:     General: No scleral icterus.    Conjunctiva/sclera: Conjunctivae  normal.  Cardiovascular:     Rate and Rhythm: Normal rate.  Pulmonary:     Effort: Pulmonary effort is normal.  Neurological:     Mental Status: He is alert and oriented to person, place, and time. Mental status is at baseline.  Psychiatric:        Mood and Affect: Mood normal.        Behavior: Behavior normal.      Results for orders placed or performed in visit on 09/11/23  HIV Antibody (routine testing w rflx)  Result Value Ref Range   HIV Screen 4th Generation wRfx Non Reactive Non Reactive  Hemoglobin A1c  Result Value Ref Range   Hgb A1c MFr Bld 5.5 4.8 - 5.6 %   Est.  average glucose Bld gHb Est-mCnc 111 mg/dL  Lipid panel  Result Value Ref Range   Cholesterol, Total 244 (H) 100 - 199 mg/dL   Triglycerides 161 (H) 0 - 149 mg/dL   HDL 39 (L) >09 mg/dL   VLDL Cholesterol Cal 45 (H) 5 - 40 mg/dL   LDL Chol Calc (NIH) 604 (H) 0 - 99 mg/dL   Chol/HDL Ratio 6.3 (H) 0.0 - 5.0 ratio  PSA Total (Reflex To Free)  Result Value Ref Range   Prostate Specific Ag, Serum 0.7 0.0 - 4.0 ng/mL   Reflex Criteria Comment     Assessment & Plan    Lower urinary tract symptoms (LUTS) -     Tadalafil ; Take 1 tablet (5 mg total) by mouth daily.  Dispense: 30 tablet; Refill: 0 -     PSA Total (Reflex To Free) -     Ambulatory referral to Urology -     PSA Total (Reflex To Free)  Decreased urine output -     Tadalafil ; Take 1 tablet (5 mg total) by mouth daily.  Dispense: 30 tablet; Refill: 0 -     PSA Total (Reflex To Free) -     Ambulatory referral to Urology -     PSA Total (Reflex To Free)  Urgency of micturition -     Tadalafil ; Take 1 tablet (5 mg total) by mouth daily.  Dispense: 30 tablet; Refill: 0 -     PSA Total (Reflex To Free) -     Ambulatory referral to Urology -     PSA Total (Reflex To Free)  Urinary frequency -     Tadalafil ; Take 1 tablet (5 mg total) by mouth daily.  Dispense: 30 tablet; Refill: 0 -     PSA Total (Reflex To Free) -     Ambulatory referral to Urology -     PSA Total (Reflex To Free)  Primary hypertension -     amLODIPine  Besylate; Take 1 tablet (10 mg total) by mouth daily.  Dispense: 90 tablet; Refill: 1  Severe obesity (BMI 35.0-35.9 with comorbidity) (HCC) -     Hemoglobin A1c  Dyslipidemia (high LDL; low HDL) -     Lipid panel  Encounter for screening for HIV -     HIV Antibody (routine testing w rflx)  Screening for endocrine, metabolic and immunity disorder -     Hemoglobin A1c  Family history of diabetes mellitus -     Hemoglobin A1c      LUTS; urinary frequency and urgency; decreased urine  output Intermittent urinary frequency and urgency with sensation of incomplete bladder emptying. Negative for UTI. Differential includes interstitial cystitis and prostate enlargement. Symptoms not consistent with kidney stones. Potential correlation with  alcohol and dietary factors. - Refer to urology for further evaluation of urinary symptoms and erectile dysfunction. - Order PSA test to evaluate for prostate enlargement or cancer. - Prescribe Cialis  daily for 30 days. Advise to stop if symptoms improve and remain improved without the medication. - Advise monitoring correlation between symptoms and alcohol consumption.  Erectile dysfunction Erectile dysfunction with difficulty maintaining erections. Potential contributing factors include alcohol consumption and possible prostate issues. Discussed potential benefits of Cialis . - Prescribe Cialis  daily for 30 days.  Advise to stop if symptoms improve and remain improved without the medication.  - Refer to urology for further evaluation of erectile dysfunction and urinary symptoms.  Hypertension Hypertension managed with amlodipine . Inconsistent medication adherence. Blood pressure monitoring at home recommended. - Increase amlodipine  to 10 mg daily. - Advise obtaining a blood pressure cuff for home monitoring and bring records to next visit.  General Health Maintenance Routine health screenings discussed. He has not been screened for HIV previously. - Order HIV screening test. - Order diabetes screening test, given family history and urinary symptoms.    Return in about 6 weeks (around 10/23/2023) for CPE, HTN, recheck.      I discussed the assessment and treatment plan with the patient  The patient was provided an opportunity to ask questions and all were answered. The patient agreed with the plan and demonstrated an understanding of the instructions.   The patient was advised to call back or seek an in-person evaluation if the symptoms  worsen or if the condition fails to improve as anticipated.    Carlean Charter, DO  [facility] [phone] (phone) [phone] (fax)  [facility]

## 2023-09-12 LAB — LIPID PANEL
Chol/HDL Ratio: 6.3 ratio — ABNORMAL HIGH (ref 0.0–5.0)
Cholesterol, Total: 244 mg/dL — ABNORMAL HIGH (ref 100–199)
HDL: 39 mg/dL — ABNORMAL LOW (ref >39)
LDL Chol Calc (NIH): 160 mg/dL — ABNORMAL HIGH (ref 0–99)
Triglycerides: 244 mg/dL — ABNORMAL HIGH (ref 0–149)
VLDL Cholesterol Cal: 45 mg/dL — ABNORMAL HIGH (ref 5–40)

## 2023-09-12 LAB — HIV ANTIBODY (ROUTINE TESTING W REFLEX): HIV Screen 4th Generation wRfx: NONREACTIVE

## 2023-09-12 LAB — HEMOGLOBIN A1C
Est. average glucose Bld gHb Est-mCnc: 111 mg/dL
Hgb A1c MFr Bld: 5.5 % (ref 4.8–5.6)

## 2023-09-12 LAB — PSA TOTAL (REFLEX TO FREE): Prostate Specific Ag, Serum: 0.7 ng/mL (ref 0.0–4.0)

## 2023-09-20 ENCOUNTER — Ambulatory Visit: Payer: Self-pay | Admitting: Physician Assistant

## 2023-10-19 ENCOUNTER — Telehealth: Payer: Self-pay

## 2023-10-19 NOTE — Telephone Encounter (Signed)
 Unable to leave message, mailbox is full. Results for his sleep study, severe obstructive sleep apnea a order for CPAP has been placed.

## 2023-10-19 NOTE — Telephone Encounter (Signed)
 CPAP order has been placed through [employer].

## 2023-10-19 NOTE — Telephone Encounter (Signed)
 Unable to leave VM will need to attempt call again  Have you seen the sleep study on him?  I don't see the results but I do see where the equipment for the study was sent to him on 09/11/23.  I have tried to call the patient to find out when he had it done and when he sent it back but his VM is full and I couldn't leave a message.      Copied from CRM [id number]. Topic: Clinical - Lab/Test Results >> Oct 19, 2023  4:28 PM Winona R wrote: Pt would like to know if his sleep study results are available

## 2023-10-23 NOTE — Telephone Encounter (Signed)
 Called patient to let him know of the sleep study results. Checked [employer] website and was able to print the report.   Results for his sleep study. severe obstructive sleep apnea

## 2023-10-24 ENCOUNTER — Encounter: Admitting: Family Medicine

## 2023-10-25 NOTE — Telephone Encounter (Signed)
 No answer when called, 3rd attempt to reach pt to inform results. Left message to call back.

## 2023-10-30 ENCOUNTER — Encounter: Payer: Self-pay | Admitting: Family Medicine

## 2023-11-28 ENCOUNTER — Telehealth: Payer: Self-pay

## 2023-11-28 NOTE — Telephone Encounter (Signed)
 Copied from CRM #[id number]. Topic: General - Other >> Nov 28, 2023 11:52 AM Tiffany S wrote: Reason for CRM: Patient is asking is there another CPAP machine that is more affordable please contact patient

## 2024-04-15 ENCOUNTER — Ambulatory Visit
Admission: EM | Admit: 2024-04-15 | Discharge: 2024-04-15 | Disposition: A | Discharge status: Home / Self Care | Attending: Emergency Medicine | Admitting: Emergency Medicine

## 2024-04-15 ENCOUNTER — Encounter: Payer: Self-pay | Admitting: Emergency Medicine

## 2024-04-15 DIAGNOSIS — H109 Unspecified conjunctivitis: Secondary | ICD-10-CM

## 2024-04-15 DIAGNOSIS — J069 Acute upper respiratory infection, unspecified: Secondary | ICD-10-CM

## 2024-04-15 DIAGNOSIS — J029 Acute pharyngitis, unspecified: Secondary | ICD-10-CM | POA: Diagnosis not present

## 2024-04-15 HISTORY — DX: Essential (primary) hypertension: I10

## 2024-04-15 LAB — POCT RAPID STREP A (OFFICE): Rapid Strep A Screen: NEGATIVE

## 2024-04-15 MED ORDER — AZITHROMYCIN 250 MG PO TABS: 250.0000 mg | ORAL_TABLET | Freq: Every day | ORAL | 0 refills | Status: AC

## 2024-04-15 MED ORDER — IPRATROPIUM BROMIDE 0.06 % NA SOLN: 2.0000 | Freq: Four times a day (QID) | NASAL | 12 refills | Status: AC

## 2024-04-15 MED ORDER — MOXIFLOXACIN HCL 0.5 % OP SOLN: 1.0000 [drp] | Freq: Three times a day (TID) | OPHTHALMIC | 0 refills | Status: AC

## 2024-04-15 NOTE — ED Provider Notes (Addendum)
 " [facility]    CSN: [id number] Arrival date & time: 04/15/24  9185      History   Chief Complaint Chief Complaint  Patient presents with   Sore Throat    HPI Treven Holtman is a 33 y.o. male.   HPI  33 year old male with past medical history negative for primary hypertension, observed sleep apnea, cardiac arrhythmia presents for evaluation of left eye swelling, irritation, and purulent discharge for 5 days along with nasal congestion with yellow nasal discharge and a sore throat.  He denies any fever, ear pain, change in vision, or cough.  Patient has been using Sudafed.  He is currently hypertensive with a blood pressure 157/104.  He is also tachycardic with heart rate of 112 and he has an elevated temp of 99.5.  Patient reports that he did take his blood pressure medication this morning.  He has not missed any doses.  Past Medical History:  Diagnosis Date   High blood pressure     Patient Active Problem List   Diagnosis Date Noted   Primary hypertension 03/16/2023   Observed sleep apnea 03/16/2023   History of nephrolithiasis 03/16/2023   Cardiac arrhythmia 03/16/2023    History reviewed. No pertinent surgical history.     Home Medications    Prior to Admission medications  Medication Sig Start Date End Date Taking? Authorizing Provider  azithromycin  (ZITHROMAX  Z-PAK) 250 MG tablet Take 1 tablet (250 mg total) by mouth daily. Take 2 tablets on the first day and then 1 tablet daily thereafter for a total of 5 days of treatment. 04/15/24  Yes Bernardino Ditch, NP  ipratropium (ATROVENT ) 0.06 % nasal spray Place 2 sprays into both nostrils 4 (four) times daily. 04/15/24  Yes Bernardino Ditch, NP  moxifloxacin  (VIGAMOX ) 0.5 % ophthalmic solution Place 1 drop into both eyes 3 (three) times daily for 7 days. 04/15/24 04/22/24 Yes Bernardino Ditch, NP  amLODipine  (NORVASC ) 10 MG tablet Take 1 tablet (10 mg total) by mouth daily. 09/11/23   Donzella Lauraine SAILOR, DO   tadalafil  (CIALIS ) 5 MG tablet Take 1 tablet (5 mg total) by mouth daily. 09/11/23   Donzella Lauraine SAILOR, DO    Family History Family History  Problem Relation Age of Onset   Hypertension Mother    Diabetes Mother    Cancer Maternal Grandmother    Cancer Maternal Grandfather    Hypertension Paternal Grandmother    Diabetes Paternal Grandmother     Social History Social History[1]   Allergies   Patient has no known allergies.   Review of Systems Review of Systems  Constitutional:  Negative for fever.  HENT:  Positive for congestion, rhinorrhea and sore throat. Negative for ear pain.   Eyes:  Positive for pain, discharge and redness. Negative for photophobia and visual disturbance.  Respiratory:  Negative for cough.      Physical Exam Triage Vital Signs ED Triage Vitals  Encounter Vitals Group     BP      Girls Systolic BP Percentile      Girls Diastolic BP Percentile      Boys Systolic BP Percentile      Boys Diastolic BP Percentile      Pulse      Resp      Temp      Temp src      SpO2      Weight      Height      Head Circumference  Peak Flow      Pain Score      Pain Loc      Pain Education      Exclude from Growth Chart    No data found.  Updated Vital Signs BP (!) 157/104 (BP Location: Left Arm)   Pulse (!) 112   Temp 99.5 F (37.5 C) (Oral)   Resp 18   SpO2 96%   Visual Acuity Right Eye Distance: 20/25 Left Eye Distance: 20/25 Bilateral Distance: 20/25 (without corrective lenses)  Right Eye Near:   Left Eye Near:    Bilateral Near:     Physical Exam Vitals and nursing note reviewed.  Constitutional:      Appearance: Normal appearance. He is not ill-appearing.  HENT:     Head: Normocephalic and atraumatic.     Nose: Congestion and rhinorrhea present.     Comments: Please mucosas edematous and erythematous with yellow discharge in both naris.    Mouth/Throat:     Mouth: Mucous membranes are moist.     Pharynx: Oropharynx is clear.  Posterior oropharyngeal erythema present. No oropharyngeal exudate.     Comments: Erythema to the soft palate bilateral tonsillar pillars.  No visible exudate. Eyes:     General:        Right eye: No discharge.        Left eye: Discharge present.    Extraocular Movements: Extraocular movements intact.     Pupils: Pupils are equal, round, and reactive to light.     Comments: Bulbar and labral conjunctiva of the left eye are erythematous and injected.  There is mucopurulent discharge in the outer canthus and matted in the lashes.  Pupils equal round reactive and EOM's intact.  Normal red light reflex in the left eye.  Musculoskeletal:     Cervical back: Normal range of motion and neck supple. No tenderness.  Lymphadenopathy:     Cervical: No cervical adenopathy.  Skin:    General: Skin is warm and dry.     Capillary Refill: Capillary refill takes less than 2 seconds.     Findings: No rash.  Neurological:     General: No focal deficit present.     Mental Status: He is alert and oriented to person, place, and time.      UC Treatments / Results  Labs (all labs ordered are listed, but only abnormal results are displayed) Labs Reviewed  POCT RAPID STREP A (OFFICE) - Normal  CULTURE, GROUP A STREP [facility])    EKG   Radiology No results found.  Procedures Procedures (including critical care time)  Medications Ordered in UC Medications - No data to display  Initial Impression / Assessment and Plan / UC Course  I have reviewed the triage vital signs and the nursing notes.  Pertinent labs & imaging results that were available during my care of the patient were reviewed by me and considered in my medical decision making (see chart for details).   Patient is a pleasant, nontoxic-appearing 33 year old male presenting for evaluation of URI symptoms as outlined in HPI above.  He is also currently hypertensive with a blood pressure of 157/104.  He reports that he did take his 10 mg of  amlodipine  this morning but he has also been using Sudafed for management of his symptoms.  He last saw his PCP in May of this year and at that time his blood pressure was 137/90.  I suspect the patient's elevated blood pressure today is secondary to his  Sudafed usage and I have encouraged him to stop.  As you can see in the image above, the patient has an erythematous and injected bulbar conjunctiva of the left eye with mucopurulent discharge in the lower lashes and in both the inner and outer canthus.  His labral conjunctiva are also erythematous and injected this is consistent with bacterial conjunctivitis and I will treat him with Vigamox  3 times daily x 7 days.  Additionally, I will order a rapid strep given the patient has been experiencing a sore throat, is febrile, and has erythema and edema to his soft palate and tonsillar pillars.  Rapid strep is negative.  I will send swab for culture.  I will discharge patient home with a diagnosis of URI and bacterial conjunctivitis.  Given that he has symptoms for 5 days and he is febrile I will do a trial of antibiotics with a 5-day course of azithromycin  in addition to the Vigamox  for the bacterial conjunctivitis.  Atrovent  nasal spray for nasal congestion.  Tylenol and/or ibuprofen  as needed for fever.  Return precautions reviewed.  Work note provided.  I also counseled the patient that if he develops any headache, changes in vision, dizziness, slurred speech, chest pain, or shortness of breath that he is to go to the ER for evaluation.   Final Clinical Impressions(s) / UC Diagnoses   Final diagnoses:  Acute pharyngitis, unspecified etiology  Bacterial conjunctivitis of left eye  Upper respiratory tract infection, unspecified type     Discharge Instructions      Your strep test is negative but we will send the swab for culture.  You have an upper respiratory tract infection as well as conjunctivitis.  Take the azithromycin  once daily for 5  days for treatment of your URI.  Use over-the-counter Tylenol and/or ibuprofen  according to the package instructions as needed for any fever or pain.  Use the Atrovent  nasal spray for your nasal congestion.  You may instill 2 squirts each nostril every 6 hours as needed for congestion or runny nose.  Stop using the Sudafed as this is causing your blood pressure to be markedly elevated.  Continue to take your amlodipine  as previously prescribed.  Instill 1 drop of Vigamox  in your left eye every 8 hours for the next 7 days for treatment of your conjunctivitis.  If your right eye develops symptoms begin treating it as well.  Avoid touching your eyes as much as possible.  Wipe down all surfaces, countertops, and doorknobs after the first and second 24 hours on eyedrops.  Wash her face with a clean wash rag to remove any drainage and use a different portion of the wash rag to clean each eye so as to not reinfect yourself.  Return for reevaluation for any new or worsening symptoms.      ED Prescriptions     Medication Sig Dispense Auth. Provider   azithromycin  (ZITHROMAX  Z-PAK) 250 MG tablet Take 1 tablet (250 mg total) by mouth daily. Take 2 tablets on the first day and then 1 tablet daily thereafter for a total of 5 days of treatment. 6 tablet Bernardino Ditch, NP   ipratropium (ATROVENT ) 0.06 % nasal spray Place 2 sprays into both nostrils 4 (four) times daily. 15 mL Bernardino Ditch, NP   moxifloxacin  (VIGAMOX ) 0.5 % ophthalmic solution Place 1 drop into both eyes 3 (three) times daily for 7 days. 3 mL Bernardino Ditch, NP      PDMP not reviewed this encounter.    Bernardino,  Venetia, NP 04/15/24 0912     [1]  Social History Tobacco Use   Smoking status: Never   Smokeless tobacco: Never  Vaping Use   Vaping status: Never Used  Substance Use Topics   Alcohol use: Never   Drug use: Never     Bernardino Venetia, NP 04/15/24 [phone]  "

## 2024-04-15 NOTE — Discharge Instructions (Addendum)
 Your strep test is negative but we will send the swab for culture.  You have an upper respiratory tract infection as well as conjunctivitis.  Take the azithromycin  once daily for 5 days for treatment of your URI.  Use over-the-counter Tylenol and/or ibuprofen  according to the package instructions as needed for any fever or pain.  Use the Atrovent  nasal spray for your nasal congestion.  You may instill 2 squirts each nostril every 6 hours as needed for congestion or runny nose.  Stop using the Sudafed as this is causing your blood pressure to be markedly elevated.  Continue to take your amlodipine  as previously prescribed.  Instill 1 drop of Vigamox  in your left eye every 8 hours for the next 7 days for treatment of your conjunctivitis.  If your right eye develops symptoms begin treating it as well.  Avoid touching your eyes as much as possible.  Wipe down all surfaces, countertops, and doorknobs after the first and second 24 hours on eyedrops.  Wash her face with a clean wash rag to remove any drainage and use a different portion of the wash rag to clean each eye so as to not reinfect yourself.  Return for reevaluation for any new or worsening symptoms.

## 2024-04-15 NOTE — ED Triage Notes (Signed)
 Patient presents to UC for sore throat, congestion, left eye swelling and irritation x 5 days. Treating symptoms with sudafed.   Denies changes to vision, fever.
